# Patient Record
Sex: Male | Born: 1990
Health system: Southern US, Community
[De-identification: ages and names within clinical notes are randomized; demographics above are authoritative.]

---
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
## 2014-05-17 ENCOUNTER — Encounter (HOSPITAL_COMMUNITY): Payer: Self-pay | Admitting: Emergency Medicine

## 2014-05-17 ENCOUNTER — Emergency Department (HOSPITAL_COMMUNITY)
Admission: EM | Admit: 2014-05-17 | Discharge: 2014-05-17 | Disposition: A | Discharge status: Home / Self Care | Payer: Worker's Compensation | Attending: Emergency Medicine | Admitting: Emergency Medicine

## 2014-05-17 DIAGNOSIS — S0501XA Injury of conjunctiva and corneal abrasion without foreign body, right eye, initial encounter: Secondary | ICD-10-CM | POA: Insufficient documentation

## 2014-05-17 DIAGNOSIS — Z79899 Other long term (current) drug therapy: Secondary | ICD-10-CM | POA: Diagnosis not present

## 2014-05-17 DIAGNOSIS — T1501XA Foreign body in cornea, right eye, initial encounter: Secondary | ICD-10-CM | POA: Diagnosis present

## 2014-05-17 MED ORDER — FLUORESCEIN SODIUM 1 MG OP STRP
ORAL_STRIP | OPHTHALMIC | Status: AC
  Administered 2014-05-17: 19:00:00
  Filled 2014-05-17: qty 1

## 2014-05-17 MED ORDER — TETRACAINE HCL 0.5 % OP SOLN
2.0000 [drp] | Freq: Once | OPHTHALMIC | Status: AC
  Administered 2014-05-17: 2 [drp] via OPHTHALMIC
  Filled 2014-05-17: qty 2

## 2014-05-17 MED ORDER — LIDOCAINE-EPINEPHRINE-TETRACAINE (LET) SOLUTION: 3.0000 mL | Freq: Once | NASAL | Status: DC

## 2014-05-17 MED ORDER — TOBRAMYCIN 0.3 % OP SOLN
2.0000 [drp] | Freq: Once | OPHTHALMIC | Status: AC
  Administered 2014-05-17: 2 [drp] via OPHTHALMIC
  Filled 2014-05-17: qty 5

## 2014-05-17 MED ORDER — FLUORESCEIN SODIUM 1 MG OP STRP: 1.0000 | ORAL_STRIP | Freq: Once | OPHTHALMIC | Status: DC

## 2014-05-17 MED ORDER — HYDROCODONE-ACETAMINOPHEN 5-325 MG PO TABS: 1.0000 | ORAL_TABLET | ORAL | Status: DC | PRN

## 2014-05-17 MED ORDER — TETRACAINE HCL 0.5 % OP SOLN: 2.0000 [drp] | Freq: Once | OPHTHALMIC | Status: DC

## 2014-05-17 MED ORDER — HYDROCODONE-ACETAMINOPHEN 5-325 MG PO TABS
2.0000 | ORAL_TABLET | Freq: Once | ORAL | Status: AC
  Administered 2014-05-17: 2 via ORAL
  Filled 2014-05-17: qty 2

## 2014-05-17 NOTE — Discharge Instructions (Signed)
Please return to the  Emergency Dept if any changes or problem, or increase in pain or changes in vision. Use 2 tobramycin eye drops to the right eye every 4 hours for 5 days. Use tylenol or ibuprofen for mild pain. Use norco for more severe pain or headache. Corneal Abrasion The cornea is the clear covering at the front and center of the eye. When you look at the colored portion of the eye, you are looking through the cornea. It is a thin tissue made up of layers. The top layer is the most sensitive layer. A corneal abrasion happens if this layer is scratched or an injury causes it to come off.  HOME CARE  You may be given drops or a medicated cream. Use the medicine as told by your doctor.  A pressure patch may be put over the eye. If this is done, follow your doctor's instructions for when to remove the patch. Do not drive or use machines while the eye patch is on. Judging distances is hard to do with a patch on.  See your doctor for a follow-up exam if you are told to do so. It is very important that you keep this appointment. GET HELP IF:   You have pain, are sensitive to light, and have a scratchy feeling in one eye or both eyes.  Your pressure patch keeps getting loose. You can blink your eye under the patch.  You have fluid coming from your eye or the lids stick together in the morning.  You have the same symptoms in the morning that you did with the first abrasion. This could be days, weeks, or months after the first abrasion healed. MAKE SURE YOU:   Understand these instructions.  Will watch your condition.  Will get help right away if you are not doing well or get worse. Document Released: 12/29/2007 Document Revised: 05/02/2013 Document Reviewed: 03/19/2013 [place] Patient Information 2015 [place], [place]. This information is not intended to replace advice given to you by your health care provider. Make sure you discuss any questions you have with your health care  provider.

## 2014-05-17 NOTE — ED Notes (Signed)
Pt states a piece of metal from a machine at work flew into rt eye, has tried to wash out eye at work, pt co rt eye pain.

## 2014-05-17 NOTE — ED Provider Notes (Signed)
CSN: [id number]     Arrival date & time 05/17/14  1805 History   First MD Initiated Contact with Patient 05/17/14 1828     Chief Complaint  Patient presents with  . Foreign Body in Eye     (Consider location/radiation/quality/duration/timing/severity/associated sxs/prior Treatment) HPI Comments: Turning the rotor of a brake when the machine flung a piece of metal into the right eye and face.  Patient is a 23 y.o. male presenting with foreign body in eye. The history is provided by the patient.  Foreign Body in Eye This is a new problem. The current episode started today. The problem occurs constantly. The problem has been unchanged. Pertinent negatives include no abdominal pain, arthralgias, chest pain, coughing, fever, nausea, neck pain or visual change. Nothing (bright lights) aggravates the symptoms. Treatments tried: eye irrigation. The treatment provided mild relief.    History reviewed. No pertinent past medical history. History reviewed. No pertinent past surgical history. History reviewed. No pertinent family history. History  Substance Use Topics  . Smoking status: Never Smoker   . Smokeless tobacco: Not on file  . Alcohol Use: Yes    Review of Systems  Constitutional: Negative for fever and activity change.       All ROS Neg except as noted in HPI  Eyes: Positive for photophobia, pain and redness. Negative for discharge.  Respiratory: Negative for cough, shortness of breath and wheezing.   Cardiovascular: Negative for chest pain and palpitations.  Gastrointestinal: Negative for nausea, abdominal pain and blood in stool.  Genitourinary: Negative for dysuria, frequency and hematuria.  Musculoskeletal: Negative for arthralgias, back pain and neck pain.  Skin: Negative.   Neurological: Negative for dizziness, seizures and speech difficulty.  Psychiatric/Behavioral: Negative for hallucinations and confusion.      Allergies  Review of patient's allergies indicates no  known allergies.  Home Medications   Prior to Admission medications   Medication Sig Start Date End Date Taking? Authorizing Provider  albuterol (PROVENTIL HFA;VENTOLIN HFA) 108 (90 BASE) MCG/ACT inhaler Inhale 2 puffs into the lungs every 6 (six) hours as needed for wheezing or shortness of breath.   Yes Historical Provider, MD  eye wash (,SODIUM/POTASSIUM/SOD CHLORIDE,) SOLN Place 1 drop into the right eye as needed (for removal of foreign object).   Yes Historical Provider, MD   BP 130/72  Pulse 91  Temp(Src) 98.3 F (36.8 C) (Oral)  Resp 18  Ht 5\' 6"  (1.676 m)  Wt 210 lb (95.255 kg)  BMI 33.91 kg/m2  SpO2 99% Physical Exam  Nursing note and vitals reviewed. Constitutional: He is oriented to person, place, and time. He appears well-developed and well-nourished.  Non-toxic appearance.  HENT:  Head: Normocephalic.  Right Ear: Tympanic membrane and external ear normal.  Left Ear: Tympanic membrane and external ear normal.  Eyes: EOM and lids are normal. Pupils are equal, round, and reactive to light. Lids are everted and swept, no foreign bodies found. Right eye exhibits no chemosis, no discharge and no hordeolum. No foreign body present in the right eye. Right conjunctiva is injected.    Neck: Normal range of motion. Neck supple. Carotid bruit is not present.  Cardiovascular: Normal rate, regular rhythm, normal heart sounds, intact distal pulses and normal pulses.   Pulmonary/Chest: Breath sounds normal. No respiratory distress.  Abdominal: Soft. Bowel sounds are normal. There is no tenderness. There is no guarding.  Musculoskeletal: Normal range of motion.  Lymphadenopathy:       Head (right side): No submandibular adenopathy  present.       Head (left side): No submandibular adenopathy present.    He has no cervical adenopathy.  Neurological: He is alert and oriented to person, place, and time. He has normal strength. No cranial nerve deficit or sensory deficit.  Skin: Skin is  warm and dry.  Psychiatric: He has a normal mood and affect. His speech is normal.    ED Course  Procedures (including critical care time) Labs Review Labs Reviewed - No data to display  Imaging Review No results found.   EKG Interpretation None      MDM  Vital signs are stable. Evaluation of the eye reveals no fluid or blood in the anterior chamber. The globe is firm to palpation. There is no drainage of vitreous fluid present.  There is evidence of a small corneal abrasion present. No evidence for foreign body. Given the mechanism of injury, doubt the patient needs a CT scan of the eyes at this time. Patient is given strict instructions to return if any difficulty with vision, increasing pain, fever, chills, drainage, or any deterioration in his general condition. Patient will be treated with tobramycin ophthalmic drops, as well as Norco for headache pain.    Final diagnoses:  Corneal abrasion, right, initial encounter    *I have reviewed nursing notes, vital signs, and all appropriate lab and imaging results for this patient.Kathie Dike**    Remedios Mckone M Alvah Lagrow, PA-C 05/23/14 1215

## 2014-05-17 NOTE — ED Notes (Signed)
Slit lamp to room

## 2014-05-23 NOTE — ED Provider Notes (Signed)
Medical screening examination/treatment/procedure(s) were performed by non-physician practitioner and as supervising physician I was immediately available for consultation/collaboration.   EKG Interpretation None       Madina Galati, MD 05/23/14 1319 

## 2015-10-17 ENCOUNTER — Other Ambulatory Visit: Payer: Self-pay | Admitting: Orthopedic Surgery

## 2015-10-17 DIAGNOSIS — S92901A Unspecified fracture of right foot, initial encounter for closed fracture: Secondary | ICD-10-CM

## 2015-10-20 ENCOUNTER — Ambulatory Visit
Admission: RE | Admit: 2015-10-20 | Discharge: 2015-10-20 | Disposition: A | Discharge status: Home / Self Care | Payer: No Typology Code available for payment source | Source: Ambulatory Visit | Attending: Orthopedic Surgery | Admitting: Orthopedic Surgery

## 2015-10-20 DIAGNOSIS — S92901A Unspecified fracture of right foot, initial encounter for closed fracture: Secondary | ICD-10-CM

## 2020-01-09 DIAGNOSIS — Z6838 Body mass index (BMI) 38.0-38.9, adult: Secondary | ICD-10-CM | POA: Diagnosis not present

## 2020-01-09 DIAGNOSIS — Z1389 Encounter for screening for other disorder: Secondary | ICD-10-CM | POA: Diagnosis not present

## 2020-01-09 DIAGNOSIS — L03818 Cellulitis of other sites: Secondary | ICD-10-CM | POA: Diagnosis not present

## 2020-01-14 ENCOUNTER — Other Ambulatory Visit (HOSPITAL_COMMUNITY): Payer: Self-pay | Admitting: Physician Assistant

## 2020-01-14 ENCOUNTER — Other Ambulatory Visit: Payer: Self-pay | Admitting: Physician Assistant

## 2020-01-14 DIAGNOSIS — R229 Localized swelling, mass and lump, unspecified: Secondary | ICD-10-CM

## 2020-01-15 ENCOUNTER — Encounter (HOSPITAL_COMMUNITY): Payer: Self-pay | Admitting: *Deleted

## 2020-01-15 ENCOUNTER — Other Ambulatory Visit: Payer: Self-pay

## 2020-01-15 ENCOUNTER — Emergency Department (HOSPITAL_COMMUNITY)
Admission: EM | Admit: 2020-01-15 | Discharge: 2020-01-15 | Disposition: A | Discharge status: Home / Self Care | Payer: BC Managed Care – PPO | Attending: Emergency Medicine | Admitting: Emergency Medicine

## 2020-01-15 ENCOUNTER — Emergency Department (HOSPITAL_COMMUNITY): Payer: BC Managed Care – PPO

## 2020-01-15 DIAGNOSIS — L0291 Cutaneous abscess, unspecified: Secondary | ICD-10-CM

## 2020-01-15 DIAGNOSIS — N492 Inflammatory disorders of scrotum: Secondary | ICD-10-CM | POA: Diagnosis not present

## 2020-01-15 DIAGNOSIS — L02214 Cutaneous abscess of groin: Secondary | ICD-10-CM | POA: Diagnosis not present

## 2020-01-15 DIAGNOSIS — N454 Abscess of epididymis or testis: Secondary | ICD-10-CM | POA: Insufficient documentation

## 2020-01-15 LAB — BASIC METABOLIC PANEL
Anion gap: 11 (ref 5–15)
BUN: 16 mg/dL (ref 6–20)
CO2: 26 mmol/L (ref 22–32)
Calcium: 9.4 mg/dL (ref 8.9–10.3)
Chloride: 98 mmol/L (ref 98–111)
Creatinine, Ser: 1.07 mg/dL (ref 0.61–1.24)
GFR calc Af Amer: >60 mL/min (ref >60)
GFR calc non Af Amer: >60 mL/min (ref >60)
Glucose, Bld: 102 mg/dL — ABNORMAL HIGH (ref 70–99)
Potassium: 4.1 mmol/L (ref 3.5–5.1)
Sodium: 135 mmol/L (ref 135–145)

## 2020-01-15 LAB — CBC WITH DIFFERENTIAL/PLATELET
Abs Immature Granulocytes: 0.05 10*3/uL (ref 0.00–0.07)
Basophils Absolute: 0 10*3/uL (ref 0.0–0.1)
Basophils Relative: 0 %
Eosinophils Absolute: 0.1 10*3/uL (ref 0.0–0.5)
Eosinophils Relative: 1 %
HCT: 45 % (ref 39.0–52.0)
Hemoglobin: 14.9 g/dL (ref 13.0–17.0)
Immature Granulocytes: 1 %
Lymphocytes Relative: 16 %
Lymphs Abs: 1.7 10*3/uL (ref 0.7–4.0)
MCH: 28.9 pg (ref 26.0–34.0)
MCHC: 33.1 g/dL (ref 30.0–36.0)
MCV: 87.2 fL (ref 80.0–100.0)
Monocytes Absolute: 0.7 10*3/uL (ref 0.1–1.0)
Monocytes Relative: 7 %
Neutro Abs: 8.1 10*3/uL — ABNORMAL HIGH (ref 1.7–7.7)
Neutrophils Relative %: 75 %
Platelets: 308 10*3/uL (ref 150–400)
RBC: 5.16 MIL/uL (ref 4.22–5.81)
RDW: 12.8 % (ref 11.5–15.5)
WBC: 10.8 10*3/uL — ABNORMAL HIGH (ref 4.0–10.5)
nRBC: 0 % (ref 0.0–0.2)

## 2020-01-15 MED ORDER — HYDROCODONE-ACETAMINOPHEN 5-325 MG PO TABS
1.0000 | ORAL_TABLET | ORAL | 0 refills | Status: AC | PRN
Start: 2020-01-15 — End: ?

## 2020-01-15 NOTE — ED Triage Notes (Signed)
Pt with abscess to left groin since last Tuesday, seen PCP on Wednesday and started on antibiotics.  Pt states it has gotten worse and came here.  Suppose to have an [place] tomorrow.  Fever yesterday, highest at 100 per pt.  Denies N/V.

## 2020-01-15 NOTE — Discharge Instructions (Addendum)
Continue taking the antibiotic you are currently on.  You may use the hydrocodone if needed for pain relief.  Do not drive within 4 hours of taking this medication as it will make you drowsy.  Call the urology group listed above for an appointment time tomorrow for an office visit for incision and drainage of this abscess site.  In the interim, if you develop any worsened pain, high fever, vomiting or any new complaints return for a recheck of this infection.  If you think your symptoms are getting worse before you are able to be seen, I recommend going to [facility] long hospital in [place] as this is the location that the urology doctors are after hours.  If unable to get there, you may get rechecked here if needed.

## 2020-01-16 ENCOUNTER — Encounter (HOSPITAL_COMMUNITY): Payer: Self-pay

## 2020-01-16 ENCOUNTER — Ambulatory Visit (HOSPITAL_COMMUNITY): Admission: RE | Admit: 2020-01-16 | Payer: BC Managed Care – PPO | Source: Ambulatory Visit

## 2020-01-16 DIAGNOSIS — L02214 Cutaneous abscess of groin: Secondary | ICD-10-CM | POA: Diagnosis not present

## 2020-01-16 NOTE — ED Provider Notes (Signed)
[facility] Provider Note   CSN: [id number] Arrival date & time: 01/15/20  1259     History Chief Complaint  Patient presents with  . Abscess    Justin Krause is a 29 y.o. male presenting for evaluation of a worsening abscess involving his left scrotum/groin region. The site first started as a small pimple which he squeezed, expressing a small amount of purulence.  A second one developed near the first, squeezed that one also, but now has worse pain and swelling. He was seen one week ago by his pcp and started on bactrim, also applying warm compresses but has continued pain and difficulty walking secondary to pain.  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  He denies fevers, chills or other complaints.  HPI     History reviewed. No pertinent past medical history.  There are no problems to display for this patient.   History reviewed. No pertinent surgical history.     History reviewed. No pertinent family history.  Social History   Tobacco Use  . Smoking status: Never Smoker  . Smokeless tobacco: Never Used  Substance Use Topics  . Alcohol use: Yes  . Drug use: No    Home Medications Prior to Admission medications   Medication Sig Start Date End Date Taking? Authorizing Provider  albuterol (PROVENTIL HFA;VENTOLIN HFA) 108 (90 BASE) MCG/ACT inhaler Inhale 2 puffs into the lungs every 6 (six) hours as needed for wheezing or shortness of breath.   Yes [provider]  sulfamethoxazole-trimethoprim (BACTRIM DS) 800-160 MG tablet Take 1 tablet by mouth 2 (two) times daily. 10 day course starting on 01/09/2020 01/09/20  Yes [provider]  HYDROcodone-acetaminophen (NORCO/VICODIN) 5-325 MG tablet Take 1 tablet by mouth every 4 (four) hours as needed. 01/15/20   Evalee Jefferson, PA-C    Allergies    Patient has no known allergies.  Review of Systems   Review of Systems  Constitutional: Negative  for chills and fever.  HENT: Negative for congestion.   Eyes: Negative.   Respiratory: Negative for chest tightness.   Cardiovascular: Negative for chest pain.  Gastrointestinal: Negative for abdominal pain, nausea and vomiting.  Genitourinary: Positive for genital sores and scrotal swelling. Negative for dysuria.  Musculoskeletal: Negative for arthralgias, joint swelling and neck pain.  Skin: Negative.  Negative for rash and wound.  Neurological: Negative for dizziness, weakness, light-headedness, numbness and headaches.  Psychiatric/Behavioral: Negative.     Physical Exam Updated Vital Signs BP (!) 138/93 (BP Location: Right Arm)   Pulse 95   Temp 98.2 F (36.8 C) (Oral)   Resp 18   Ht 5\' 8"  (1.727 m)   Wt 120.2 kg   SpO2 100%   BMI 40.29 kg/m   Physical Exam Vitals and nursing note reviewed. Exam conducted with a chaperone present.  Constitutional:      Appearance: He is well-developed.  HENT:     Head: Normocephalic and atraumatic.  Cardiovascular:     Rate and Rhythm: Normal rate and regular rhythm.     Heart sounds: Normal heart sounds.  Pulmonary:     Effort: Pulmonary effort is normal.     Breath sounds: Normal breath sounds. No wheezing.  Abdominal:     General: Bowel sounds are normal.     Palpations: Abdomen is soft.     Tenderness: There is no abdominal tenderness.  Genitourinary:    Testes:        Left: Tenderness and  swelling present.     Comments: Large tender area of induration left upper scrotum.  No extension to the thigh or suprapubic region.  No drainage or tenting, no fluctuance. Musculoskeletal:        General: Normal range of motion.     Cervical back: Normal range of motion.  Skin:    General: Skin is warm and dry.  Neurological:     Mental Status: He is alert.     ED Results / Procedures / Treatments   Labs (all labs ordered are listed, but only abnormal results are displayed) Labs Reviewed  CBC WITH DIFFERENTIAL/PLATELET -  Abnormal; Notable for the following components:      Result Value   WBC 10.8 (*)    Neutro Abs 8.1 (*)    All other components within normal limits  BASIC METABOLIC PANEL - Abnormal; Notable for the following components:   Glucose, Bld 102 (*)    All other components within normal limits    EKG None  Radiology US SCROTUM W/DOPPLER  Result Date: 01/15/2020 CLINICAL DATA:  Left groin abscess x1 week. EXAM: SCROTAL ULTRASOUND DOPPLER ULTRASOUND OF THE TESTICLES TECHNIQUE: Complete ultrasound examination of the testicles, epididymis, and other scrotal structures was performed. Color and spectral Doppler ultrasound were also utilized to evaluate blood flow to the testicles. COMPARISON:  None. FINDINGS: Right testicle Measurements: 4.8 cm x 2.6 cm x 3.2 cm. No mass or microlithiasis visualized. Left testicle Measurements: 4.5 cm x 2.6 cm x 3.4 cm. No mass or microlithiasis visualized. Right epididymis:  Normal in size and appearance. Left epididymis:  Normal in size and appearance. Hydrocele:  None visualized. Varicocele:  None visualized. Pulsed Doppler interrogation of both testes demonstrates normal low resistance arterial and venous waveforms bilaterally. Of incidental note is the presence of a 5.1 cm x 3.7 cm x 3.1 cm area of heterogeneous hypoechogenicity seen lateral to the superior aspect of the left testicle. Increased flow is seen within this region on color Doppler evaluation. IMPRESSION: 1. Findings consistent with a left groin abscess seen superior to the lateral aspect of the left testicle. Electronically Signed   By: Aram Candela M.D.   On: 01/15/2020 15:53    Procedures Procedures (including critical care time)  Bedside US also performed by Dr. Pilar Plate confirming the depth of the abscess to 5 cm into the scrotum, multiple loculations present, extending to the testicle border.  Medications Ordered in ED Medications - No data to display  ED Course  I have reviewed the triage  vital signs and the nursing notes.  Pertinent labs & imaging results that were available during my care of the patient were reviewed by me and considered in my medical decision making (see chart for details).    MDM Rules/Calculators/A&P                          Pt with a left complicated and deep scrotal abscess.  Discussed with Dr. Berneice Heinrich of urology.  Pt is stable, afebrile, VSS, will plan office visit tomorrow with plans for I&D.  Pt to call in am for appt time.  Advised to continue abx, added hydrocodone for pain relief.  Advised to have someone drive him to this appt.  Pt agrees with and understands plan.  Final Clinical Impression(s) / ED Diagnoses Final diagnoses:  Abscess    Rx / DC Orders ED Discharge Orders         Ordered  HYDROcodone-acetaminophen (NORCO/VICODIN) 5-325 MG tablet  Every 4 hours PRN     Discontinue  Reprint     01/15/20 1730           Burgess Amor, Cordelia Poche 01/16/20 1049    Sabas Sous, MD 01/16/20 2040

## 2020-01-18 DIAGNOSIS — L02214 Cutaneous abscess of groin: Secondary | ICD-10-CM | POA: Diagnosis not present

## 2020-01-21 DIAGNOSIS — L02214 Cutaneous abscess of groin: Secondary | ICD-10-CM | POA: Diagnosis not present

## 2020-01-23 DIAGNOSIS — L02214 Cutaneous abscess of groin: Secondary | ICD-10-CM | POA: Diagnosis not present

## 2020-01-25 DIAGNOSIS — L02214 Cutaneous abscess of groin: Secondary | ICD-10-CM | POA: Diagnosis not present

## 2021-05-03 IMAGING — US US SCROTUM W/ DOPPLER COMPLETE
1 series · 13 of 25 positions shown · non-contrast
Comparison: None.

CLINICAL DATA: Left groin abscess x1 week.

EXAM:
SCROTAL ULTRASOUND
DOPPLER ULTRASOUND OF THE TESTICLES
TECHNIQUE: Complete ultrasound examination of the testicles, epididymis, and
other scrotal structures was performed. Color and spectral Doppler
ultrasound were also utilized to evaluate blood flow to the
testicles.

[Series 1: us scrotum w/ doppler complete · 0.06mm/px · 13 of 113 slices shown]
[im 1/113]
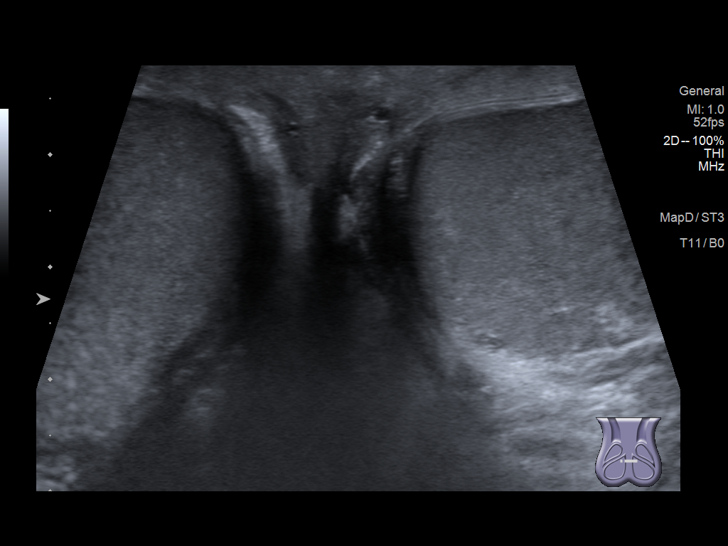
[im 10/113]
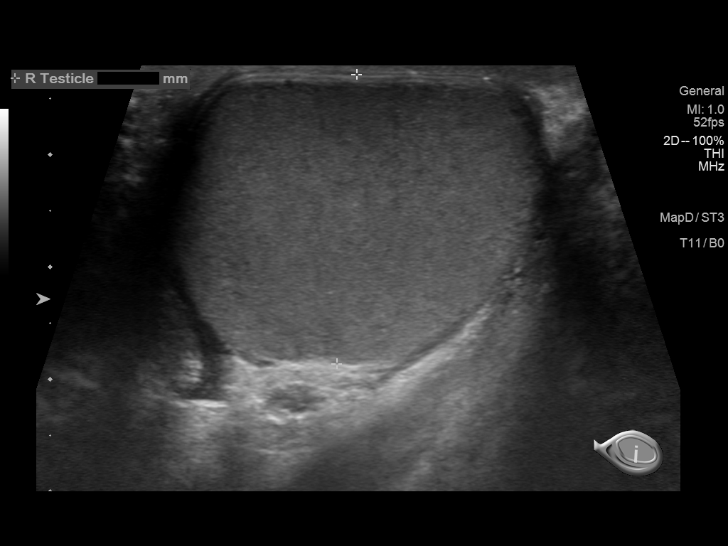
[im 19/113]
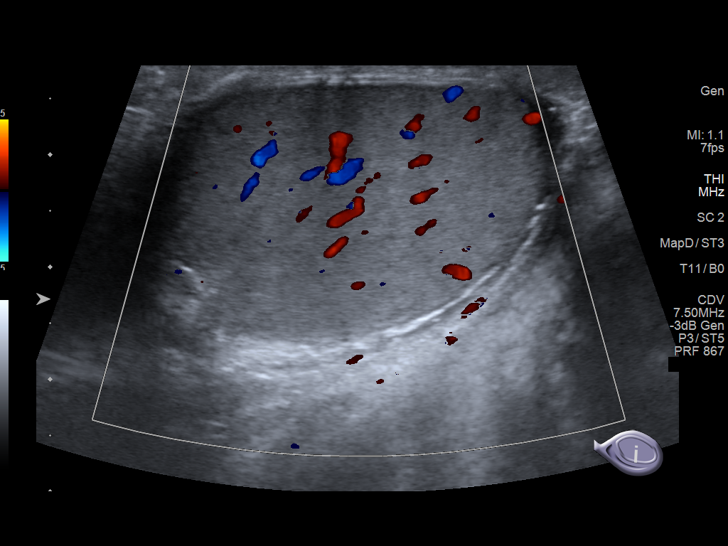
[im 29/113]
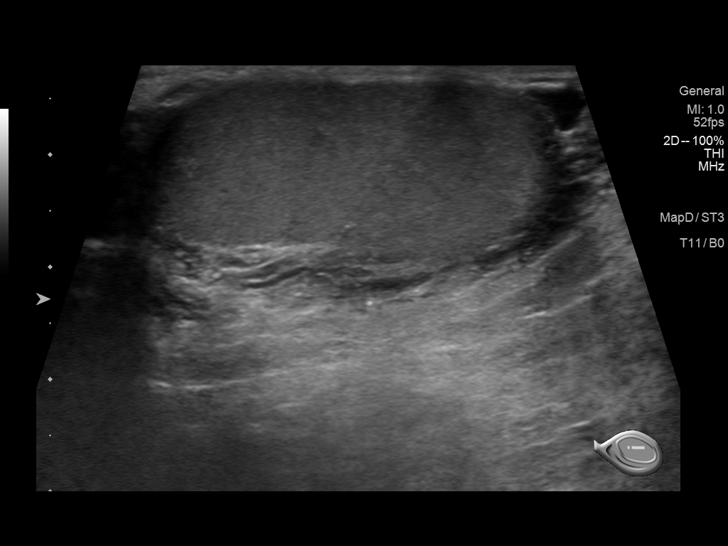
[im 38/113]
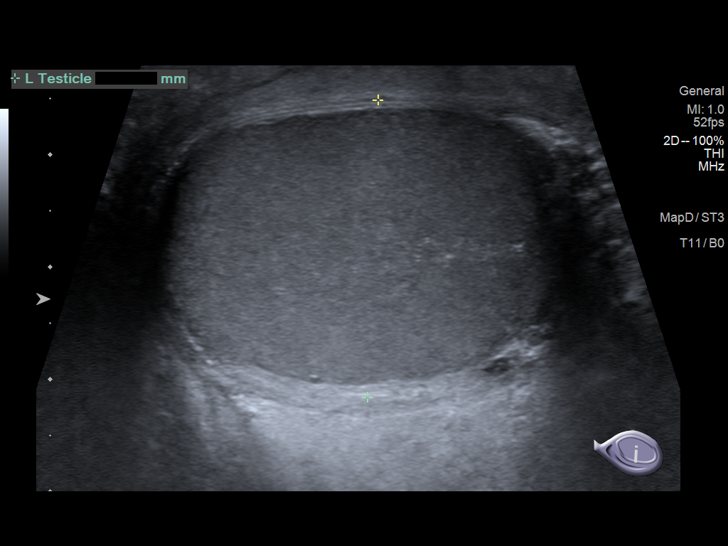
[im 47/113]
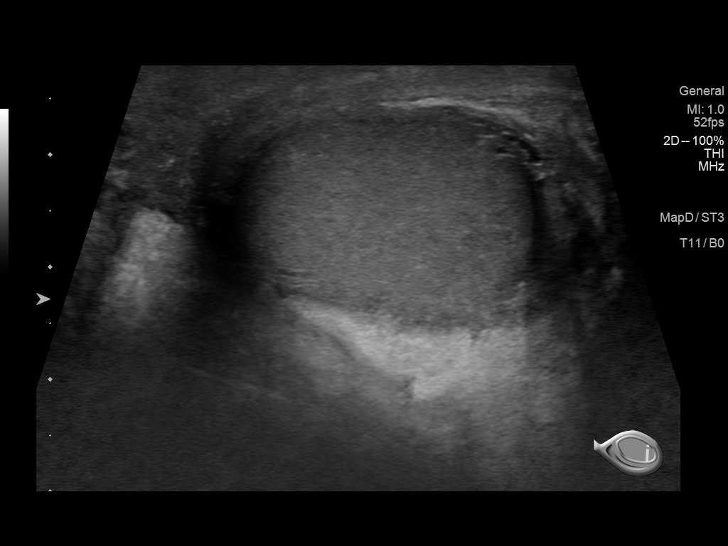
[im 57/113]
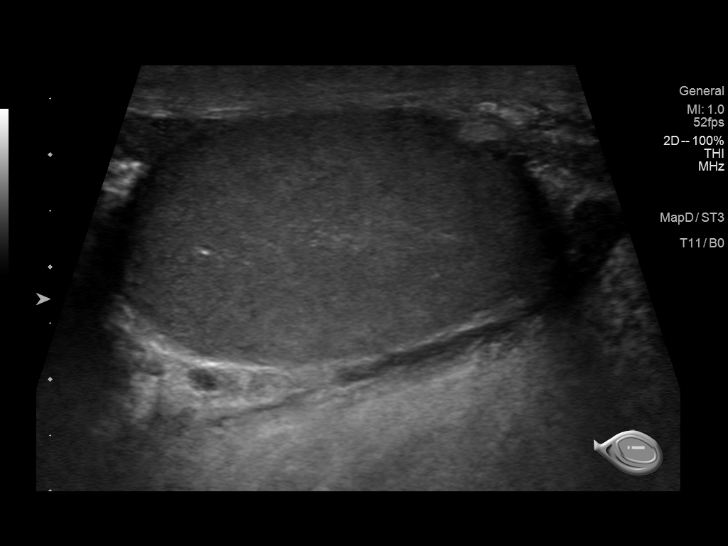
[im 66/113]
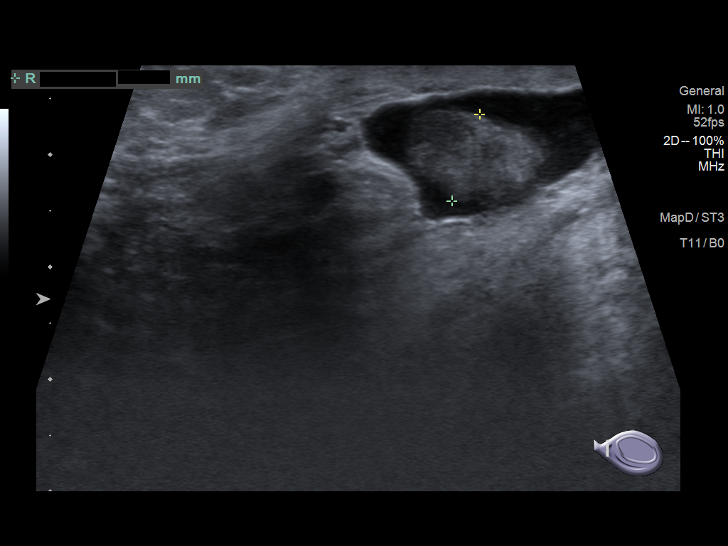
[im 75/113]
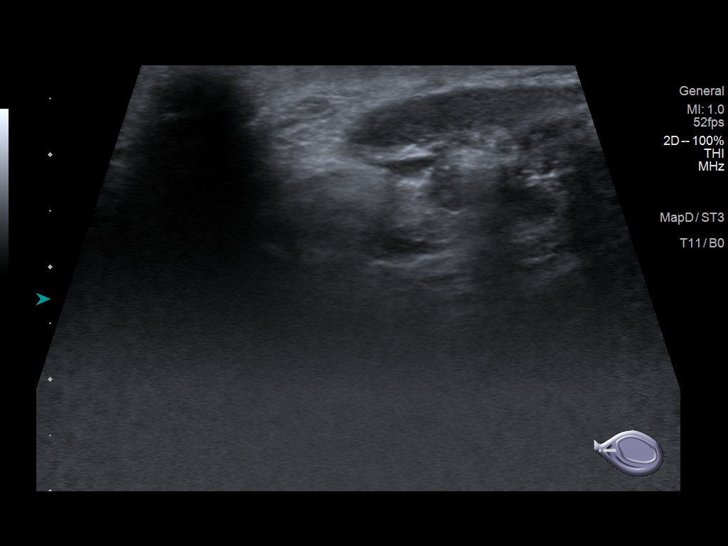
[im 85/113]
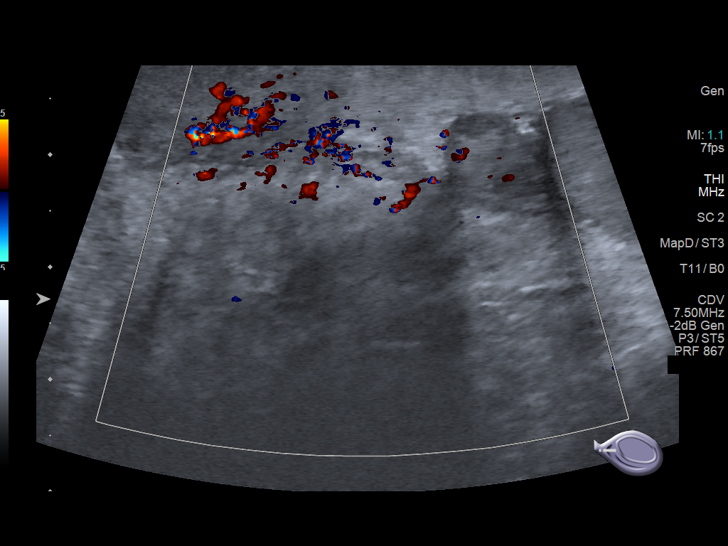
[im 94/113]
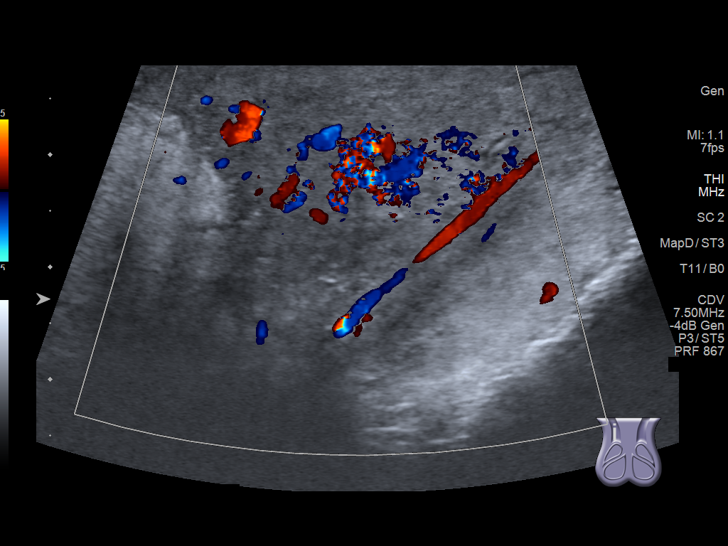
[im 103/113]
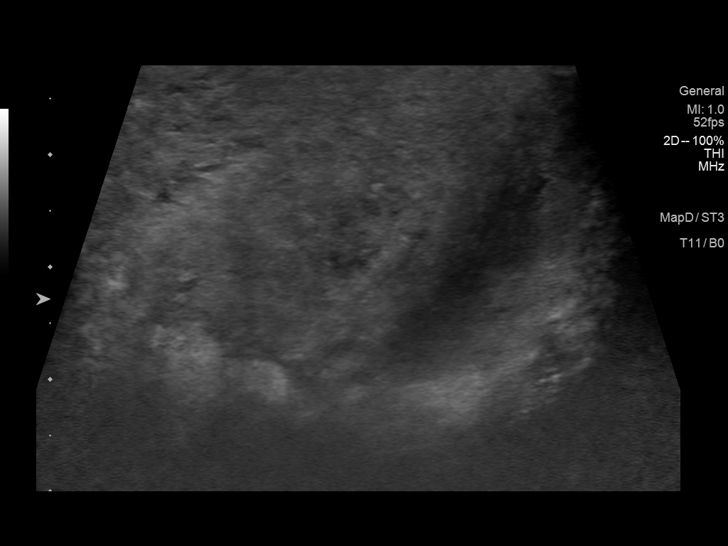
[im 113/113]
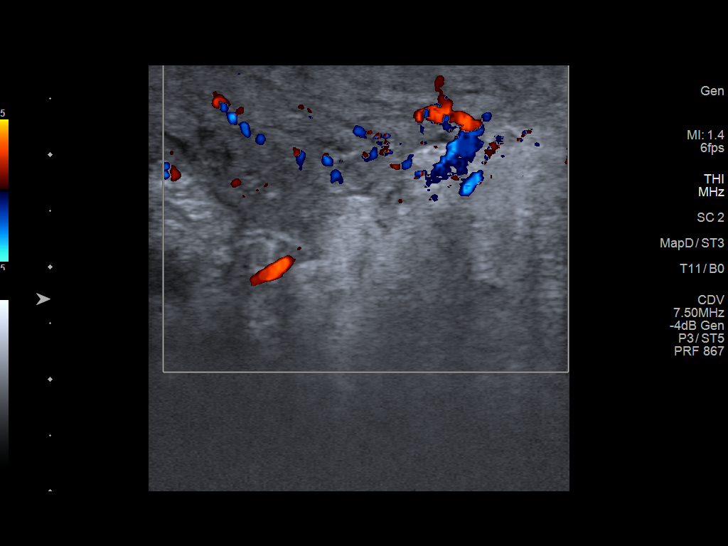

[13 of 25 positions shown; findings below may reference images not displayed]

FINDINGS: Right testicle

Measurements: 4.8 cm x 2.6 cm x 3.2 cm. No mass or microlithiasis
visualized.

Left testicle

Measurements: 4.5 cm x 2.6 cm x 3.4 cm. No mass or microlithiasis
visualized.

Right epididymis:  Normal in size and appearance.

Left epididymis:  Normal in size and appearance.

Hydrocele:  None visualized.

Varicocele:  None visualized.

Pulsed Doppler interrogation of both testes demonstrates normal low
resistance arterial and venous waveforms bilaterally.

Of incidental note is the presence of a 5.1 cm x 3.7 cm x 3.1 cm
area of heterogeneous hypoechogenicity seen lateral to the superior
aspect of the left testicle. Increased flow is seen within this
region on color Doppler evaluation.
IMPRESSION: 1. Findings consistent with a left groin abscess seen superior to
the lateral aspect of the left testicle.

## 2022-04-03 DIAGNOSIS — Z6839 Body mass index (BMI) 39.0-39.9, adult: Secondary | ICD-10-CM | POA: Diagnosis not present

## 2022-04-03 DIAGNOSIS — H66001 Acute suppurative otitis media without spontaneous rupture of ear drum, right ear: Secondary | ICD-10-CM | POA: Diagnosis not present

## 2022-04-03 DIAGNOSIS — H6122 Impacted cerumen, left ear: Secondary | ICD-10-CM | POA: Diagnosis not present

## 2022-06-20 DIAGNOSIS — J018 Other acute sinusitis: Secondary | ICD-10-CM | POA: Diagnosis not present

## 2022-06-20 DIAGNOSIS — R059 Cough, unspecified: Secondary | ICD-10-CM | POA: Diagnosis not present

## 2022-06-20 DIAGNOSIS — Z6841 Body Mass Index (BMI) 40.0 and over, adult: Secondary | ICD-10-CM | POA: Diagnosis not present
